# Patient Record
Sex: Female | Born: 1935 | Race: Black or African American | Hispanic: No | Smoking: Never smoker
Health system: Southern US, Community
[De-identification: ages and names within clinical notes are randomized; demographics above are authoritative.]

## PROBLEM LIST (undated history)

## (undated) DIAGNOSIS — I10 Essential (primary) hypertension: Secondary | ICD-10-CM

## (undated) DIAGNOSIS — N189 Chronic kidney disease, unspecified: Secondary | ICD-10-CM

## (undated) DIAGNOSIS — E119 Type 2 diabetes mellitus without complications: Secondary | ICD-10-CM

## (undated) HISTORY — DX: Chronic kidney disease, unspecified: N18.9

## (undated) HISTORY — DX: Essential (primary) hypertension: I10

## (undated) HISTORY — DX: Type 2 diabetes mellitus without complications: E11.9

---

## 2001-07-09 ENCOUNTER — Ambulatory Visit (HOSPITAL_COMMUNITY): Admission: RE | Admit: 2001-07-09 | Discharge: 2001-07-09 | Payer: Self-pay | Admitting: *Deleted

## 2001-07-09 ENCOUNTER — Encounter: Payer: Self-pay | Admitting: *Deleted

## 2001-07-10 ENCOUNTER — Inpatient Hospital Stay (HOSPITAL_COMMUNITY): Admission: AD | Admit: 2001-07-10 | Discharge: 2001-07-12 | Payer: Self-pay | Admitting: Pulmonary Disease

## 2001-08-23 ENCOUNTER — Ambulatory Visit (HOSPITAL_COMMUNITY): Admission: RE | Admit: 2001-08-23 | Discharge: 2001-08-23 | Payer: Self-pay | Admitting: Pulmonary Disease

## 2001-11-11 ENCOUNTER — Encounter: Payer: Self-pay | Admitting: *Deleted

## 2001-11-11 ENCOUNTER — Ambulatory Visit (HOSPITAL_COMMUNITY): Admission: RE | Admit: 2001-11-11 | Discharge: 2001-11-11 | Payer: Self-pay | Admitting: *Deleted

## 2001-12-25 ENCOUNTER — Ambulatory Visit (HOSPITAL_COMMUNITY): Admission: RE | Admit: 2001-12-25 | Discharge: 2001-12-25 | Payer: Self-pay | Admitting: *Deleted

## 2001-12-28 ENCOUNTER — Emergency Department (HOSPITAL_COMMUNITY): Admission: EM | Admit: 2001-12-28 | Discharge: 2001-12-28 | Payer: Self-pay | Admitting: Emergency Medicine

## 2001-12-28 ENCOUNTER — Encounter: Payer: Self-pay | Admitting: Emergency Medicine

## 2002-05-13 ENCOUNTER — Ambulatory Visit (HOSPITAL_COMMUNITY): Admission: RE | Admit: 2002-05-13 | Discharge: 2002-05-13 | Payer: Self-pay | Admitting: *Deleted

## 2003-05-28 ENCOUNTER — Ambulatory Visit (HOSPITAL_COMMUNITY): Admission: RE | Admit: 2003-05-28 | Discharge: 2003-05-28 | Payer: Self-pay | Admitting: *Deleted

## 2003-05-28 ENCOUNTER — Encounter: Payer: Self-pay | Admitting: *Deleted

## 2003-06-25 ENCOUNTER — Ambulatory Visit (HOSPITAL_COMMUNITY): Admission: RE | Admit: 2003-06-25 | Discharge: 2003-06-25 | Payer: Self-pay | Admitting: Vascular Surgery

## 2003-07-02 ENCOUNTER — Ambulatory Visit (HOSPITAL_COMMUNITY): Admission: RE | Admit: 2003-07-02 | Discharge: 2003-07-03 | Payer: Self-pay | Admitting: Otolaryngology

## 2003-08-27 ENCOUNTER — Ambulatory Visit (HOSPITAL_COMMUNITY): Admission: RE | Admit: 2003-08-27 | Discharge: 2003-08-27 | Payer: Self-pay | Admitting: Nephrology

## 2003-12-22 ENCOUNTER — Ambulatory Visit (HOSPITAL_COMMUNITY): Admission: RE | Admit: 2003-12-22 | Discharge: 2003-12-23 | Payer: Self-pay | Admitting: Ophthalmology

## 2004-05-17 ENCOUNTER — Ambulatory Visit (HOSPITAL_COMMUNITY): Admission: RE | Admit: 2004-05-17 | Discharge: 2004-05-17 | Payer: Self-pay | Admitting: Podiatry

## 2005-01-27 ENCOUNTER — Emergency Department (HOSPITAL_COMMUNITY): Admission: EM | Admit: 2005-01-27 | Discharge: 2005-01-27 | Payer: Self-pay | Admitting: Emergency Medicine

## 2007-03-01 ENCOUNTER — Ambulatory Visit (HOSPITAL_COMMUNITY): Admission: RE | Admit: 2007-03-01 | Discharge: 2007-03-01 | Payer: Self-pay | Admitting: Nephrology

## 2008-04-20 ENCOUNTER — Ambulatory Visit (HOSPITAL_COMMUNITY): Admission: RE | Admit: 2008-04-20 | Discharge: 2008-04-20 | Payer: Self-pay | Admitting: Pulmonary Disease

## 2008-07-22 ENCOUNTER — Ambulatory Visit (HOSPITAL_COMMUNITY): Admission: RE | Admit: 2008-07-22 | Discharge: 2008-07-22 | Payer: Self-pay | Admitting: Nephrology

## 2008-08-25 ENCOUNTER — Encounter (INDEPENDENT_AMBULATORY_CARE_PROVIDER_SITE_OTHER): Payer: Self-pay | Admitting: Pulmonary Disease

## 2008-08-25 ENCOUNTER — Ambulatory Visit (HOSPITAL_COMMUNITY): Admission: RE | Admit: 2008-08-25 | Discharge: 2008-08-25 | Payer: Self-pay | Admitting: Pulmonary Disease

## 2008-08-25 ENCOUNTER — Ambulatory Visit: Payer: Self-pay | Admitting: Cardiology

## 2010-09-18 ENCOUNTER — Encounter: Payer: Self-pay | Admitting: Nephrology

## 2010-12-25 ENCOUNTER — Emergency Department (HOSPITAL_COMMUNITY): Payer: No Typology Code available for payment source

## 2010-12-25 ENCOUNTER — Emergency Department (HOSPITAL_COMMUNITY)
Admission: EM | Admit: 2010-12-25 | Discharge: 2010-12-25 | Disposition: A | Discharge status: Home / Self Care | Payer: No Typology Code available for payment source | Attending: Emergency Medicine | Admitting: Emergency Medicine

## 2010-12-25 DIAGNOSIS — Y92410 Unspecified street and highway as the place of occurrence of the external cause: Secondary | ICD-10-CM | POA: Insufficient documentation

## 2010-12-25 DIAGNOSIS — M79609 Pain in unspecified limb: Secondary | ICD-10-CM | POA: Insufficient documentation

## 2010-12-25 DIAGNOSIS — IMO0002 Reserved for concepts with insufficient information to code with codable children: Secondary | ICD-10-CM | POA: Insufficient documentation

## 2010-12-25 DIAGNOSIS — M549 Dorsalgia, unspecified: Secondary | ICD-10-CM | POA: Insufficient documentation

## 2011-01-13 NOTE — Op Note (Signed)
NAMEJHANIYA, BRISKI                          ACCOUNT NO.:  [id number]   MEDICAL RECORD NO.:  [id number]                   PATIENT TYPE:  OIB   LOCATION:  2899                                 FACILITY:  [facility]   PHYSICIAN:  Beulah Gandy. Ashley Royalty, M.D.              DATE OF BIRTH:  1933-12-23   DATE OF PROCEDURE:  12/22/2003  DATE OF DISCHARGE:                                 OPERATIVE REPORT   PREOPERATIVE DIAGNOSIS:  Vitreous hemorrhage in the left eye, proliferative  diabetic retinopathy left eye.   PROCEDURE:  Pars plana vitrectomy with membrane peel left eye.  Retinal  photocoagulation left eye.   SURGEON:  Beulah Gandy. Ashley Royalty, M.D.   ASSISTANT:  Bryan Lemma. Lundquist, P.A.   ANESTHESIA:  General.   DESCRIPTION OF PROCEDURE:  Usual prep and drape.  Peritomies at 10, 2, and 4  o'clock.  4 mm infusion port anchored into place at 4 o'clock.  The lighted  pick and the cutter were placed at 10 and 2 o'clock, respectively.  The pars  plana vitrectomy was begun behind the cataractous lens.  Provisc was placed  on the corneal surface and the 175 Biome was moved into place for viewing.  The blood in the vitreous cavity was carefully removed under low suction and  rapid cutting.  Extensive membranes were seen on the buckle surface and  these were peeled with the lighted pick and removed with the vitreous  cutter.  The vitrectomy was carried down to the macular surface where a  large black scar was present.  Membranes were attached to the edge of the  scar and these were carefully teased away and trimmed and removed.  The  vitrectomy was carried out into the far periphery where additional vitreous  and blood were encountered and removed.  Once all of this was removed, the  endolaser was positioned in the eye.  840 burns were placed around the  retinal periphery with a power of 800 milliwatts, 1000 microns each and 0.1  seconds each.  A washout procedure was performed.  The instruments were then  removed from the eye.  9-0 nylon was used to close the sclerotomy sites.  They were tested with a Weksell and found to be tight.  The conjunctiva was  closed with Wetfield cautery.  Polymixin and gentamicin were irrigated into  tenon's space. Atropine solution was applied.  Decadron 10 mg was injected  into the lower subconjunctival space. The closing tension was 10 with a  Barraquer tonometer.  Complications were none.  Duration one hour.  Polysporin, a patch, and shield were placed.  The patient was awakened and  taken to the recovery room in satisfactory condition.  Beulah Gandy. Ashley Royalty, M.D.    JDM/MEDQ  D:  12/22/2003  T:  12/22/2003  Job:  [id number]

## 2011-01-13 NOTE — Group Therapy Note (Signed)
[facility]  Patient:    [name] Munoz, [name] Munoz Visit Number: [id number] MRN: [id number]          Service Type: MED Location: 2A A202 01 Attending Physician:  Fredirick Maudlin Dictated by:   Kari Baars, M.D. Proc. Date: 07/11/01 Admit Date:  07/10/2001                               Progress Note  PROBLEM LIST: 1. End-stage renal failure. 2. Hypertension.  SUBJECTIVE:  [name] Munoz received dialysis last night and received 1 units of packed red blood cells.  Her hemoglobin is still low; she is going to need more blood.  She has had severe problems with blood pressure.  Otherwise, she has had no new complaints.  She denies any chest pain, fever, chills, etc.  OBJECTIVE:  Her physical exam today shows that her chest is relatively clear. Her heart is regular.  Her abdomen is soft.  Extremities show no edema.  ASSESSMENT: She is still having problems with blood pressure.  PLAN:  Continue with her medications as before, and I am going to add Catapres.  She is going to receive more blood. Dictated by:   Kari Baars, M.D. Attending Physician:  Fredirick Maudlin DD:  07/11/01 TD:  07/11/01 Job: [id number]

## 2011-01-13 NOTE — Group Therapy Note (Signed)
[facility]  Patient:    BRETTE, CAST Visit Number: [id number] MRN: [id number]          Service Type: MED Location: 2A A202 01 Attending Physician:  Fredirick Maudlin Dictated by:   Kari Baars, M.D. Admit Date:  07/10/2001                               Progress Note  PROBLEMS:  Diabetes, severe hypertension, renal failure initiating dialysis.  SUBJECTIVE:  Ms. Levengood says that she is feeling better.  She has had multiple changes made in her antihypertensives and her blood pressure is very well-controlled now, running 130/60.  OBJECTIVE:  Her chest is quite clear.  Her heart is regular.  Her abdomen is soft.  She looks more comfortable.  ASSESSMENT:  She is improving.  PLAN:  Plan is to continue with her treatments and medicines without change. I am hopeful that she will be able to be discharged soon.  I believe she is going to have dialysis again today and will leave decision as to discharge up to Dr. Maudry Diego after the dialysis.  If she cannot go home today, she might be able to go home tomorrow, provided that her blood pressure remains as good as it is.  Her blood sugar has been running around 130 or so.  I do not plan any other new changes in her treatments right now. Dictated by:   Kari Baars, M.D. Attending Physician:  Fredirick Maudlin DD:  07/12/01 TD:  07/12/01 Job: [id number]

## 2011-01-13 NOTE — Consult Note (Signed)
[facility]  Patient:    [name] Munoz, MINI Visit Number: [id number] MRN: [id number]          Service Type: MED Location: 2A A202 01 Attending Physician:  Fredirick Maudlin Dictated by:   Maudry Diego, M.D. Admit Date:  07/10/2001                            Consultation Report  CONTINUATION  PHYSICAL EXAMINATION  GENERAL:  Patient is alert, in no apparent distress.  VITAL SIGNS:  Blood pressure 182/71, heart rate 86, respiratory rate 16.  HEENT:  No conjunctival pallor.  Non-icteric.  Oral mucosa:  Moist and pink.  NECK:  Supple.  No JVD.  CHEST:  Clear to auscultation.  HEART:  Regular rate and rhythm.  No murmur.  ABDOMEN:  Soft, positive bowel sounds.  EXTREMITIES:  No edema.  LABORATORIES:  As stated above, her BUN was 117, creatinine 8.2 with creatinine clearance of 9 cc/minute.  She has a normal potassium of 3.8, albumin 3.3.  ASSESSMENT: 1. Renal insufficiency, chronic.  At this moment patient is reaching end-stage, possibly uremic.  Patient with history of nausea, decreased appetite, feeling sick, and also somewhat anxious with BUN and creatinine which are at very high levels.  She has some edema, but no significant sign of fluid overload. 2. Hypertension.  Patient on Norvasc and labetalol.  Her blood pressure seems to be slightly high, but usually within acceptable range. 3. Diabetes.  Only on Amaryl.  Her blood sugar occasionally becomes low possibly because of worsening renal insufficiency. 4. Anemia.  RECOMMENDATION:  Will initiate hemodialysis.  Once patient feels better will discharge to an outpatient where we will continue with dialysis.  Will do CBC and check her H&H.  If the H&H is low will consider for blood transfusion. Dictated by:   Maudry Diego, M.D. Attending Physician:  Fredirick Maudlin DD:  07/10/01 TD:  07/10/01 Job: [id number]

## 2011-01-13 NOTE — Op Note (Signed)
[place]  Patient:    [name] Munoz, [name] Munoz Visit Number: [id number] MRN: [id number]          Service Type: DSU Location: [facility] 2859 01 Attending Physician:  Melvenia Needles Dictated by:   Denman George, M.D. Proc. Date: 07/09/01 Admit Date:  07/09/2001 Discharge Date: 07/09/2001   CC:         Dr. Yetta Barre, Accident   Operative Report  SURGEON:  Denman George, M.D.  ASSISTANT:  Tollie Pizza. Collins, P.A.-C.  ANESTHESIA:  Local with MAC.  ANESTHESIOLOGIST:  Kaylyn Layer. Michelle Piper, M.D.  PREOPERATIVE DIAGNOSIS:  Chronic renal insufficiency.  POSTOPERATIVE DIAGNOSIS:  Chronic renal insufficiency.  PROCEDURES: 1. Left Cimino fistula. 2. Right internal jugular Diatek dialysis catheter.  CLINICAL NOTE:  A 77 year old female referred for hemodialysis access.  She is diabetic with hypertension and failing renal function.  OPERATIVE PROCEDURE:  The patient is brought to the operating room in stable condition.  Placed in a supine position.  Left arm is prepped and draped in a sterile fashion.  Skin and subcutaneous tissue instilled with 1% Xylocaine.  A longitudinal skin incision made over the left cephalic vein at the wrist.  Dissection carried down through the subcutaneous tissue.  A 3-mm cephalic vein identified, mobilized, and controlled distally with clips.  The vein divided.  The radial artery free and encircled with vessel loops.  The patient administered 2000 units of heparin intravenously.  The radial artery controlled with Seraphim clamps proximally and distally.  A longitudinal arteriotomy made.  The cephalic vein divided and anastomosed end-to-side to the radial artery with running 8-0 Prolene suture. Clamps were then removed.  Excellent flow present through the vein.  Adequate hemostasis obtained.  Sponge and instrument counts correct.  Subcutaneous tissue closed with running 3-0 Vicryl suture.  Skin closed with 4-0  Monocryl.  A large dorsal vein was noted over the hand and a small stab incision made over this which was ligated with 3-0 silk tie.  The incision then closed with interrupted 3-0 Vicryl suture.  The patient then repositioned and the right neck and chest prepped and draped in a sterile fashion.  Skin and subcutaneous tissue at the base of right neck instilled with 1% Xylocaine.  A needle easily introduced into the right internal jugular vein. A 0.035 J wire was passed through the needle into the superior vena cava.  A dilator was passed over the guidewire.  A 16-French dilator and sheath advanced over the guidewire.  The dilator and guidewire removed.  The catheter placed through the sheath and the sheath was removed.  The catheter was positioned at the superior vena cava and right atrial junction.  A tunneling device was then passed subcutaneously to the catheter site and the catheter brought out through the skin in the right supraclavicular fossa.  The hub then assembled onto the catheter and then divided and the hub assembled. The catheter was flushed with heparin and saline solution and capped with heparin.  The patient tolerated the procedure well.  The insertion site was closed with interrupted 3-0 nylon suture.  Sterile dressings applied.  The catheter was affixed to the skin with 2-0 silk suture.  The patient was transferred to the recovery room in stable condition.  Chest x-ray ordered. Dictated by:   Denman George, M.D. Attending Physician:  Melvenia Needles DD:  07/09/01 TD:  07/09/01 Job: [id number]

## 2011-01-13 NOTE — Op Note (Signed)
[place]  Patient:    [name] Munoz, [name] Munoz Visit Number: [id number] MRN: [id number]          Service Type: DSU Location: [facility] 2854 01 Attending Physician:  Melvenia Needles Dictated by:   Denman George, M.D. Proc. Date: 12/25/01 Admit Date:  12/25/2001 Discharge Date: 12/25/2001                             Operative Report  PREOPERATIVE DIAGNOSES: 1. End-stage renal failure. 2. Poorly-functioning left Cimino fistula.  POSTOPERATIVE DIAGNOSES: 1. End-stage renal failure. 2. Poorly-functioning left Cimino fistula.  PROCEDURE:  Revision, left Cimino fistula.  SURGEON:  Denman George, M.D.  ASSISTANT:  Maple Mirza, P.A.  ANESTHESIA:  Local with MAC.  ANESTHESIOLOGIST:  Guadalupe Maple, M.D.  CLINICAL NOTE:  This is a 77 year old female with end-stage renal failure. She currently is on hemodialysis.  She has an Ash catheter in place for dialysis purposes.  She previously underwent placement of a left Cimino fistula.  This, however, has failed to mature adequately.  A fistulogram was performed and reveals a tight stenosis at the anastomosis between the vein and artery and also some large tributaries draining the vein.  She is brought to the operating room at this time for revision of the fistula with hopes this will improve flow through the main cephalic vein and allow dialysis to be performed.  DESCRIPTION OF PROCEDURE:  Patient brought to the operating room in stable condition.  Placed in the supine position.  Left arm prepped and draped in a sterile fashion.  Skin and subcutaneous tissue instilled with 1% Xylocaine with epinephrine. Longitudinal skin incision made through the scar at the left wrist. Dissection carried down through the subcutaneous tissue.  The arteriovenous fistula anastomosis was identified.  The radial artery was mobilized proximally and encircled with a vessel loop.  The vein was also freed.   The vein ligated at the anastomosis with 2-0 silk.  The vein then divided.  The radial artery then controlled proximally and distally with serrefine clamps. A longitudinal arteriotomy made.  The vein beveled and anastomosed end-to-side to the radial artery with running 7-0 Prolene suture.  Clamps were then removed.  The vein was then examined, and two areas of draining tributaries were identified, small incisions made over these, and tributaries of the cephalic vein ligated with 3-0 silk.  Incisions were then closed with 3-0 Vicryl for subcutaneous layer and interrupted 4-0 nylon for skin.  Sterile dressings were applied.  The patient tolerated the procedure well.  Transferred to the recovery room in stable condition. Dictated by:   Denman George, M.D. Attending Physician:  Melvenia Needles DD:  12/25/01 TD:  12/26/01 Job: [id number]

## 2011-01-13 NOTE — Consult Note (Signed)
[facility]  Patient:    [name] Munoz, [name] Munoz Visit Number: [id number] MRN: [id number]          Service Type: MED Location: 2A A202 01 Attending Physician:  Fredirick Maudlin Dictated by:   Maudry Diego, M.D. Proc. Date: 07/10/01 Admit Date:  07/10/2001                            Consultation Report  INCOMPLETE  REASON FOR CONSULT:  Uremia, end-stage.  HISTORY OF PRESENT ILLNESS:  Ms. Messing is a 77 year old African-American female with past medical history of hypertension, diabetes, renal insufficiency thought to be secondary to diabetes who has been followed in my office presently admitted after patient started complaining of weakness and decreased appetite and restlessness.  Ms. Rorabaugh has chronic renal failure reaching end-stage.  Initially patient was advised to have an access but was very hesitant and because of that she delayed having an access.  Simply she started having decreased appetite, nausea and also generally feeling sick, she went for a second opinion which she was told to go ahead with the dialysis. Patient was referred to Dr. Madilyn Fireman where he put a Perma-Cath.  However, patients daughter called back mentioning that the patient seems to be sick, hence she was admitted for initiation of dialysis.  Her BUN and creatinine has increased to 117 and 8.2, respectively.  PAST MEDICAL HISTORY: 1. Diabetes for more than 13 years. 2. History of hypertension for more than 20 years. 3. Diabetic retinopathy status post laser treatment. 4. History of hysterectomy for fibroid. 5. History of chronic renal insufficiency reaching end-stage. 6. History of hypoglycemia. 7. History of congestive heart failure.  ALLERGIES:  PENICILLIN.  CURRENT MEDICATIONS: 1. Norvasc 10 mg p.o. q.d. 2. Levatol 200 mg two tablets in the morning and one tablet in the evening. 3. Amaryl 1 mg p.o. q.d. 4. Lasix 100 mg p.o. q.d.  SOCIAL HISTORY:  No history of smoking or  alcohol.  Patient has very strong supportive family.  She has daughters who have been helping her.  FAMILY HISTORY:  There is history of diabetes but no history of renal insufficiency or coronary artery disease.  However, patient has firsthand experience with dialysis because her husband had renal insufficiency on dialysis before he died.  REVIEW OF SYSTEMS:  No fever, chills or sweating.  Decreased appetite, nausea and generalized weakness.  Feeling of ill health.  No chest pain.  Occasional shortness of breath.  No dizziness, lightheadedness.  No diarrhea, urgency or frequency.  LABORATORY DATA:  Her blood work from July 10, 2001, shows BUN is 110, creatinine 7.3.  She has creatinine clearance done on July 05, 2001, BUN 115, creatinine 8.2 during that time, creatinine clearance was 9 cc per minute.  Her albumin is 3.3, calcium of 7.8, with potassium of 3.8.  At this moment, unfortunately we do not have a CBC.  ASSESSMENT: Dictated by:   Maudry Diego, M.D. Attending Physician:  Fredirick Maudlin DD:  07/10/01 TD:  07/10/01 Job: [id number]

## 2011-01-13 NOTE — H&P (Signed)
[facility]  Patient:    [name] Munoz, ROUNDY Visit Number: [id number] MRN: [id number]          Service Type: MED Location: 2A A202 01 Attending Physician:  Fredirick Maudlin Dictated by:   Kari Baars, M.D. Admit Date:  07/10/2001                           History and Physical  REASON FOR ADMISSION:  This is a 77 year old who has end-stage chronic renal failure.  She had been diagnosed with renal failure some year or so ago.  She has a history of hypertension and she has a history of diabetes, all of which are felt to have contributed to her problems with renal failure.  She had maintained fairly stable renal function but this has begun deteriorating recently and now she has creatinine levels of around 8 with BUNs of greater than 100.  Her hemoglobin now is about 6 and she is brought into the hospital to initiate hemodialysis and for further evaluation and treatment.  PAST MEDICAL HISTORY: 1. End-stage chronic renal failure. 2. Long history of diabetes. 3. Long history of hypertension.  SOCIAL HISTORY:  Negative for significant smoking.  She does not drink any alcohol.  She has had no exposure to toxins as far as is known.  FAMILY HISTORY:  Positive for hypertension and diabetes but no history of end-stage renal disease as far as she knows.  PHYSICAL EXAMINATION:  GENERAL:  Well-developed, well-nourished female who is in no acute distress right now.  VITAL SIGNS:  Blood pressure 130/80.  NECK:  She has a hemodialysis catheter in her neck.  CHEST:  Fairly clear.  HEART:  Regular without gallop.  ABDOMEN:  Soft.  EXTREMITIES:  Showed no edema.  LABORATORY DATA:  Laboratories are as mentioned.  ASSESSMENT: 1. She has what appears to be end-stage chronic renal failure.  PLAN:  For her to be treated with hemodialysis. Dictated by:   Kari Baars, M.D. Attending Physician:  Fredirick Maudlin DD:  07/10/01 TD:  07/10/01 Job:  [id number]

## 2011-01-13 NOTE — Op Note (Signed)
NAMEAAMIRAH, [name] Munoz                          ACCOUNT NO.:  [id number]   MEDICAL RECORD NO.:  [id number]                   PATIENT TYPE:  OIB   LOCATION:  2870                                 FACILITY:  [facility]   PHYSICIAN:  Janetta Hora. Fields, MD               DATE OF BIRTH:  1934/06/10   DATE OF PROCEDURE:  06/25/2003  DATE OF DISCHARGE:  06/25/2003                                 OPERATIVE REPORT   PREOPERATIVE DIAGNOSIS:  End-stage renal disease.   POSTOPERATIVE DIAGNOSIS:  End-stage renal disease.   OPERATION PERFORMED:  1. Right neck ultrasound.  2. Insertion of right internal jugular vein Diatek catheter.  3. Ligation of left arm arteriovenous fistula.  4. Creation of left forearm arteriovenous graft.   SURGEON:  Janetta Hora. Fields, MD   ASSISTANT:  Toribio Harbour, N.P.   ANESTHESIA:  Local with IV sedation.   INDICATIONS FOR PROCEDURE:  The patient is a 77 year old female with a  poorly functioning left arm Cimino fistula.  She requires long term  hemodialysis access.   DESCRIPTION OF PROCEDURE:  After obtaining informed consent, the patient was  taken to the operating room.  The patient was placed in supine position on  the operating table.  Next, a [facility] ultrasound was used to perform an  ultrasound of the right neck.  The right internal jugular vein was found to  be widely patent with normal respiratory variability and compression.  Next  the patient's entire neck and chest were prepped and draped in the usual  sterile fashion.  Local anesthesia was infiltrated over the right internal  jugular vein.  A small gauge finder needle was used to localize the right  internal jugular vein.  A large introducer needle was then used to cannulate  the right internal jugular vein and a 0.035 J-tip guidewire was placed  through the guidewire and into the inferior vena cava using a modified  Seldinger technique.  Next, sequential 12, 14, and 16 French dilators were  placed over the guidewire under fluoroscopic guidance.  The peel-away sheath  was then placed into the internal jugular vein.  A 19 cm Diatek catheter was  brought up in the operative field and placed over the guidewire using a  weave technique through the peel away sheath.  The catheter was placed into  the right atrium under fluoroscopic guidance.  The peel-away sheath was then  removed and the tip was confirmed to be in the right atrium.  The guidewire  was removed as well.  Next, the catheter was tunneled out subcutaneously  over the right clavicular region.  The catheter was sutured into place with  nylon sutures.  The needle insertion site was closed with a Vicryl stitch.  The catheter was noted to flush and draw easily.  The catheter was then  inspected under fluoroscopy and found to have its tip in the right atrium  and no kinks throughout its course.  Dry sterile dressings were applied.  The patient tolerated this portion of the procedure well.  Next, attention  was turned to the left arm.  The entire left upper extremity was prepped and  draped in the usual sterile fashion.  Local anesthesia was infiltrated over  the left antecubital region.  An incision was made just above the  antecubital crease and the dissection was carried down through the  subcutaneous tissues down to the level of the brachial artery.  A large  basilic vein was found in this location. The basilic vein was approximately  4 mm in diameter.  The brachial artery was also 4 mm in diameter.  The  basilic vein was dissected free circumferentially for approximately 3 cm.  Brachial artery was dissected free for a similar amount of distance.  Both  of these vessels were controlled with vessel loops.  Next, local anesthesia  was infiltrated in the midforearm.  A subcutaneous tunnel was then created  up to the level of this local anesthesia infiltration and an incision was  made in this location for assistance in  creating the tunnel.  A 6 mm PTFE  graft was then brought up to the operative field and placed through the  subcutaneous tunnel.  The patient was then given 5000 units of intravenous  heparin.  Next the vessel loops were pulled taut on the vein and a vertical  venotomy was made.  The graft was spatulated and an end of graft to side of  artery anastomosis was created using a running 6-0 Prolene suture.  The  anastomosis was back-bled and fore-bled and then the graft clamped to  prevent bleeding into the graft.  The clamps on the vein were then released.  Next, the graft was spatulated on the arterial end and the vessel loops were  pulled taut.  A vertical arteriotomy was made in the brachial artery and an  end of graft to side of artery anastomosis was created using a running 6-0  Prolene suture.  The usual flushing procedures commenced at nearing the  completion of the anastomosis.  After the anastomosis was completed, the  graft was opened initially followed by release of proximal control and then  release of distal control of the brachial artery.  The graft was noted to  fill immediately and have a palpable thrill. At this point attention was  turned to the lower forearm at the level of the fistula.  The incision from  a previous creation of the Cimino fistula was reopened.  Due to the low flow  in this fistula, it was difficult to find the venous portion of the fistula.  Therefore a small counterincision was made up a few centimeters on the  forearm over the level where the fistula was palpable.  The fistula was  dissected free and a 2-0 silk ligature was placed around it in this  location.  In addition, a segment more proximal on the cephalic vein was  dissected free circumferentially and ligated with two silk ties.  Hemostasis  was obtained in all incisions.  Subcutaneous tissue was then closed with a  running Vicryl stitch at the apex loop graft incision and the antecubital incision.   The skin of all four incisions was then closed with a running 4-0  Vicryl subcuticular stitch.  Benzoin and Steri-Strips were applied to all  incisions.  Dry sterile dressings were then placed on the arm.   The patient tolerated  the procedure well.  There were no complications.  Sponge, needle and instrument counts were correct at the end of the case.  The patient was then transferred to the recovery room in stable condition.                                                Janetta Hora. Fields, MD    CEF/MEDQ  D:  06/25/2003  T:  06/26/2003  Job:  [id number]

## 2011-01-13 NOTE — Discharge Summary (Signed)
[facility]  Patient:    [name] Munoz, [name] Munoz Visit Number: [id number] MRN: [id number]          Service Type: MED Location: 2A A202 01 Attending Physician:  Fredirick Maudlin Dictated by:   Kari Baars, M.D. Admit Date:  07/10/2001 Discharge Date: 07/12/2001                             Discharge Summary  DISCHARGE DIAGNOSES: 1. End-stage chronic renal failure. 2. Severe hypertension. 3. Anemia secondary to #1. 4. Diabetes mellitus.  BRIEF HISTORY:  This is a 77 year old who has had increasing problems with renal failure in the last several months.  She has had chronic renal failure but is has not gotten to the point that she is going to require dialysis which is being arranged.  She had a catheter placed about 2 days ago and is being brought to initiate dialysis.  PAST MEDICAL HISTORY:  Positive for diabetes and hypertension.  PHYSICAL EXAMINATION:  GENERAL:  On admission showed a well-developed well-nourished female who is in no acute distress.  VITAL SIGNS:  Her blood pressure initially was well controlled ______ much higher.   Her lab work showed that her BUN was greater than 100, creatinine around 8.  Hemoglobin was quite low.  CHEST:  Her chest fairly clear.  HEART:  Regular.  ABDOMEN:  Soft.  EXTREMITIES:  Showed no edema.  She had a dialysis catheter placed in the left neck.  ASSESSMENT:  On admission her assessment is that she had end-stage renal failure that was going to require dialysis.  HOSPITAL COURSE:  Dialysis was undertaken and she received dialysis 3 times while hospitalized.  Her blood pressure medications had to be modified but she eventually arteriosclerotic heart disease good control of her blood pressure. Her blood sugar remained under good control initially using sliding-scale insulin.  By the time of discharge she was much improved and ended up with her dialysis arranged as an outpatient and her  antihypertensives are being managed by Dr. Kristian Covey.  These were changed on several occasions during the hospital and she is going to have this as mentioned arranged by Dr. Kristian Covey.  She is going to continue on her medications for her diabetes and she is going to continue all of her other treatments at home. Dictated by:   Kari Baars, M.D. Attending Physician:  Fredirick Maudlin DD:  07/13/01 TD:  07/14/01 Job: [id number]

## 2011-01-13 NOTE — Op Note (Signed)
[name] Munoz, [name] Munoz                          ACCOUNT NO.:  [id number]   MEDICAL RECORD NO.:  [id number]                   PATIENT TYPE:  OIB   LOCATION:  5502                                 FACILITY:  [facility]   PHYSICIAN:  Beulah Gandy. Ashley Royalty, M.D.              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   OPERATION PERFORMED:  Scleral buckle, left eye.  Retinal photocoagulation,  left eye.  Perfluoropropane injection, left eye.   SURGEON:  Beulah Gandy. Ashley Royalty, M.D.   ASSISTANT:  Merian Capron, MA   ANESTHESIA:  General.   DESCRIPTION OF PROCEDURE:  Usual prep and drape.  360 degree limbal  peritomy.  Isolation of four rectus muscles on 2-0 silk.  Localization of  break at 11:30 o'clock.  Scleral dissection from 8 o'clock around to 4  o'clock to admit a #279 intrascleral implant.  Diathermy placed in the bed.  Additional posterior dissection carried out at 11:30 o'clock.  The 279  implant was placed against the globe with a 508G radial segment placed  beneath the break at 11:30 o'clock.  A 240 band was placed around the eye  with a 270 sleeve at 8 o'clock.  Belt loops were placed at 5 and 6 o'clock.  Perforation site chosen at 12 o'clock with a large amount of clear colorless  subretinal fluid coming forth.  Perfluoropropane 50% was injected into the  vitreous cavity to reinflate the globe.  The volume was 1.30mL.  The indirect  ophthalmoscopy showed the retinal to be lying nicely on the scleral buckle.  The indirect ophthalmoscope laser was moved into place, 241 burns were  placed around the retinal break at 11:30 o'clock.  The power was , 1000  microns each and 0.1 seconds each.  The buckle was adjusted and trimmed.  The band  was adjusted and trimmed.  Sutures were knotted and the free ends  removed.  The conjunctiva was reposited with 7-0 chromic suture.  Polymyxin  and gentamicin were irrigated into Tenon's space.  Atropine solution was  applied.  Paracentesis times one.  Obtained closing tension of 10 with  Barraquer tonometer.  Marcaine was injected around the globe for  postoperative pain.  Decadron 10 mg was injected into the lower  subconjunctival space.  Polysporin, a patch and shield were placed.  The  patient was awakened and taken to recovery in satisfactory condition.   COMPLICATIONS:  None.   DURATION:  One and a half hours.  Beulah Gandy. Ashley Royalty, M.D.    JDM/MEDQ  D:  07/02/2003  T:  07/03/2003  Job:  [id number]

## 2011-03-28 ENCOUNTER — Ambulatory Visit (INDEPENDENT_AMBULATORY_CARE_PROVIDER_SITE_OTHER): Payer: Medicare Other | Admitting: Ophthalmology

## 2011-03-28 DIAGNOSIS — H43819 Vitreous degeneration, unspecified eye: Secondary | ICD-10-CM

## 2011-03-28 DIAGNOSIS — E11359 Type 2 diabetes mellitus with proliferative diabetic retinopathy without macular edema: Secondary | ICD-10-CM

## 2011-03-28 DIAGNOSIS — H33009 Unspecified retinal detachment with retinal break, unspecified eye: Secondary | ICD-10-CM

## 2011-05-30 LAB — CBC
HCT: 32.8 — ABNORMAL LOW
MCV: 99.4
Platelets: 183
RBC: 3.3 — ABNORMAL LOW
WBC: 5.6

## 2011-05-30 LAB — BASIC METABOLIC PANEL
CO2: 24
Calcium: 9.6
Chloride: 109
Creatinine, Ser: 8.56 — ABNORMAL HIGH
GFR calc Af Amer: 6 — ABNORMAL LOW
Potassium: 5.1
Sodium: 142

## 2011-05-30 LAB — PROTIME-INR
INR: 1
Prothrombin Time: 13.2

## 2011-06-13 LAB — POTASSIUM: Potassium: 5.2 — ABNORMAL HIGH

## 2011-09-05 ENCOUNTER — Ambulatory Visit (INDEPENDENT_AMBULATORY_CARE_PROVIDER_SITE_OTHER): Payer: Medicare Other | Admitting: Ophthalmology

## 2011-09-05 DIAGNOSIS — E11359 Type 2 diabetes mellitus with proliferative diabetic retinopathy without macular edema: Secondary | ICD-10-CM

## 2011-09-05 DIAGNOSIS — E1165 Type 2 diabetes mellitus with hyperglycemia: Secondary | ICD-10-CM

## 2011-09-05 DIAGNOSIS — H33009 Unspecified retinal detachment with retinal break, unspecified eye: Secondary | ICD-10-CM

## 2011-09-05 DIAGNOSIS — H43819 Vitreous degeneration, unspecified eye: Secondary | ICD-10-CM

## 2012-03-05 ENCOUNTER — Ambulatory Visit (INDEPENDENT_AMBULATORY_CARE_PROVIDER_SITE_OTHER): Payer: Medicare Other | Admitting: Ophthalmology

## 2012-03-14 ENCOUNTER — Ambulatory Visit (INDEPENDENT_AMBULATORY_CARE_PROVIDER_SITE_OTHER): Payer: Medicare Other | Admitting: Ophthalmology

## 2012-03-14 DIAGNOSIS — E1139 Type 2 diabetes mellitus with other diabetic ophthalmic complication: Secondary | ICD-10-CM

## 2012-03-14 DIAGNOSIS — E11359 Type 2 diabetes mellitus with proliferative diabetic retinopathy without macular edema: Secondary | ICD-10-CM

## 2012-03-14 DIAGNOSIS — H33009 Unspecified retinal detachment with retinal break, unspecified eye: Secondary | ICD-10-CM

## 2012-03-14 DIAGNOSIS — H43819 Vitreous degeneration, unspecified eye: Secondary | ICD-10-CM

## 2012-03-14 DIAGNOSIS — I10 Essential (primary) hypertension: Secondary | ICD-10-CM

## 2012-09-17 ENCOUNTER — Ambulatory Visit (INDEPENDENT_AMBULATORY_CARE_PROVIDER_SITE_OTHER): Payer: Medicare Other | Admitting: Ophthalmology

## 2012-09-17 DIAGNOSIS — E1165 Type 2 diabetes mellitus with hyperglycemia: Secondary | ICD-10-CM

## 2012-09-17 DIAGNOSIS — H43819 Vitreous degeneration, unspecified eye: Secondary | ICD-10-CM

## 2012-09-17 DIAGNOSIS — Q143 Congenital malformation of choroid: Secondary | ICD-10-CM

## 2012-09-17 DIAGNOSIS — I10 Essential (primary) hypertension: Secondary | ICD-10-CM

## 2012-09-17 DIAGNOSIS — H35039 Hypertensive retinopathy, unspecified eye: Secondary | ICD-10-CM

## 2012-09-17 DIAGNOSIS — E11359 Type 2 diabetes mellitus with proliferative diabetic retinopathy without macular edema: Secondary | ICD-10-CM

## 2012-09-17 DIAGNOSIS — E1139 Type 2 diabetes mellitus with other diabetic ophthalmic complication: Secondary | ICD-10-CM

## 2012-12-12 ENCOUNTER — Ambulatory Visit: Payer: Self-pay | Admitting: Vascular Surgery

## 2013-03-18 ENCOUNTER — Ambulatory Visit (INDEPENDENT_AMBULATORY_CARE_PROVIDER_SITE_OTHER): Payer: Medicare Other | Admitting: Ophthalmology

## 2013-04-15 ENCOUNTER — Ambulatory Visit (INDEPENDENT_AMBULATORY_CARE_PROVIDER_SITE_OTHER): Payer: Medicare Other | Admitting: Ophthalmology

## 2013-04-15 DIAGNOSIS — H43819 Vitreous degeneration, unspecified eye: Secondary | ICD-10-CM

## 2013-04-15 DIAGNOSIS — H35039 Hypertensive retinopathy, unspecified eye: Secondary | ICD-10-CM

## 2013-04-15 DIAGNOSIS — I10 Essential (primary) hypertension: Secondary | ICD-10-CM

## 2013-04-15 DIAGNOSIS — E1139 Type 2 diabetes mellitus with other diabetic ophthalmic complication: Secondary | ICD-10-CM

## 2013-04-15 DIAGNOSIS — E11359 Type 2 diabetes mellitus with proliferative diabetic retinopathy without macular edema: Secondary | ICD-10-CM

## 2013-10-16 ENCOUNTER — Ambulatory Visit (INDEPENDENT_AMBULATORY_CARE_PROVIDER_SITE_OTHER): Payer: Medicare Other | Admitting: Ophthalmology

## 2013-10-23 ENCOUNTER — Ambulatory Visit (INDEPENDENT_AMBULATORY_CARE_PROVIDER_SITE_OTHER): Payer: Medicare Other | Admitting: Ophthalmology

## 2013-11-18 ENCOUNTER — Ambulatory Visit (INDEPENDENT_AMBULATORY_CARE_PROVIDER_SITE_OTHER): Payer: Medicare Other

## 2013-11-18 VITALS — BP 139/42 | HR 75 | Resp 16 | Ht 62.0 in | Wt 160.0 lb

## 2013-11-18 DIAGNOSIS — S90129A Contusion of unspecified lesser toe(s) without damage to nail, initial encounter: Secondary | ICD-10-CM

## 2013-11-18 DIAGNOSIS — E1140 Type 2 diabetes mellitus with diabetic neuropathy, unspecified: Secondary | ICD-10-CM

## 2013-11-18 DIAGNOSIS — E1149 Type 2 diabetes mellitus with other diabetic neurological complication: Secondary | ICD-10-CM

## 2013-11-18 DIAGNOSIS — E1142 Type 2 diabetes mellitus with diabetic polyneuropathy: Secondary | ICD-10-CM

## 2013-11-18 DIAGNOSIS — L03039 Cellulitis of unspecified toe: Secondary | ICD-10-CM

## 2013-11-18 NOTE — Patient Instructions (Signed)
ANTIBACTERIAL SOAP INSTRUCTIONS  THE DAY AFTER PROCEDURE  Please follow the instructions your doctor has marked.   Shower as usual. Before getting out, place a drop of antibacterial liquid soap (Dial) on a wet, clean washcloth.  Gently wipe washcloth over affected area.  Afterward, rinse the area with warm water.  Blot the area dry with a soft cloth and cover with antibiotic ointment (neosporin, polysporin, bacitracin) and band aid or gauze and tape  Place 3-4 drops of antibacterial liquid soap in a quart of warm tap water.  Submerge foot into water for 20 minutes.  If bandage was applied after your procedure, leave on to allow for easy lift off, then remove and continue with soak for the remaining time.  Next, blot area dry with a soft cloth and cover with a bandage.  After cleansing with either soap and water or Epsom salts in warm water dry the toe thoroughly and apply the meuprosin Bactrim ointment and Band-Aid as instructed

## 2013-11-18 NOTE — Progress Notes (Signed)
   Subjective:    Patient ID: [name] Munoz, female    DOB: 08-31-1933, 80 y.o.   MRN: [id number]  HPI Comments: "I bumped this toe"  Patient states that she bumped the 1st toe right about 2.5 weeks ago. The nail came loose. It did not bleed. She trimmed the nail back and saw PCP. He Rx'd Avelox 400 mg QD and Mupirocin ointment BID. She soaked initially. Nail still loose and wanted checked.     Review of Systems  HENT: Negative.   Respiratory: Negative.   Cardiovascular: Positive for leg swelling.  Gastrointestinal: Negative.   Genitourinary: Negative.   Musculoskeletal: Positive for gait problem.  Skin: Negative.   Allergic/Immunologic: Negative.   Neurological: Positive for weakness and numbness.  Hematological: Negative.   Psychiatric/Behavioral: Negative.   All other systems reviewed and are negative.       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 Patient is a wheelchair nonambulatory. Epicritic and proprioceptive sensations grossly diminished on [facility] there is normal plantar response DTRs not listed dermatologically skin color pigment normal hair growth absent nails somewhat criptotic incurvated darkened patient does have contusion to the right hallux nail plate which show some partial separation distally there is mild serous drainage and some maceration patient is been applying the Bactroban ointment and soaking Epson salts has been taking her Avelox antibiotic as instructed. There are no other open wounds no secondary infections no ascending psoas lymphangitis noted at this time. No malodor noted at this time. 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 Patient is a contusion to right hallux nail plate nail bed with separation of the loose portion nails debrided away at this time the nailbed appears to be clean although there was a nailbed laceration advised to continue with daily cleansing with Epson salts also maintain antibiotic ointment Bactroban ointment daily application Silvadene gauze dressing applied at today's visit. Reappointed one month for long-term followup on a do normal bathing and hygiene otherwise maintain the of the ointment and a Band-Aid to keep the toe nailbed protected. If there's any exacerbations increased redness swelling discharge drainage or odor she is to contact us daily to the emergency room as instructed. Followup in one month for reevaluation and nail check  Alvan Dameichard Seiya Silsby DPM

## 2013-11-20 ENCOUNTER — Ambulatory Visit (INDEPENDENT_AMBULATORY_CARE_PROVIDER_SITE_OTHER): Payer: Medicare Other | Admitting: Ophthalmology

## 2013-11-20 DIAGNOSIS — I10 Essential (primary) hypertension: Secondary | ICD-10-CM

## 2013-11-20 DIAGNOSIS — E11359 Type 2 diabetes mellitus with proliferative diabetic retinopathy without macular edema: Secondary | ICD-10-CM

## 2013-11-20 DIAGNOSIS — E1139 Type 2 diabetes mellitus with other diabetic ophthalmic complication: Secondary | ICD-10-CM

## 2013-11-20 DIAGNOSIS — H35039 Hypertensive retinopathy, unspecified eye: Secondary | ICD-10-CM

## 2013-11-20 DIAGNOSIS — H35379 Puckering of macula, unspecified eye: Secondary | ICD-10-CM

## 2013-11-20 DIAGNOSIS — H43819 Vitreous degeneration, unspecified eye: Secondary | ICD-10-CM

## 2013-11-20 DIAGNOSIS — E1165 Type 2 diabetes mellitus with hyperglycemia: Secondary | ICD-10-CM

## 2013-11-20 DIAGNOSIS — H31009 Unspecified chorioretinal scars, unspecified eye: Secondary | ICD-10-CM

## 2013-12-16 ENCOUNTER — Ambulatory Visit (INDEPENDENT_AMBULATORY_CARE_PROVIDER_SITE_OTHER): Payer: Medicare Other

## 2013-12-16 VITALS — BP 114/52 | HR 76 | Resp 12

## 2013-12-16 DIAGNOSIS — E1142 Type 2 diabetes mellitus with diabetic polyneuropathy: Secondary | ICD-10-CM

## 2013-12-16 DIAGNOSIS — S90129A Contusion of unspecified lesser toe(s) without damage to nail, initial encounter: Secondary | ICD-10-CM

## 2013-12-16 DIAGNOSIS — E1140 Type 2 diabetes mellitus with diabetic neuropathy, unspecified: Secondary | ICD-10-CM

## 2013-12-16 DIAGNOSIS — E1149 Type 2 diabetes mellitus with other diabetic neurological complication: Secondary | ICD-10-CM

## 2013-12-16 DIAGNOSIS — L03039 Cellulitis of unspecified toe: Secondary | ICD-10-CM

## 2013-12-16 NOTE — Progress Notes (Signed)
   Subjective:    Patient ID: [name] Munoz, female    DOB: [birth year], 80 y.o.   MRN: [id number]  HPI '' RT FOOT GREAT TOE IS DOING AND [facility].    Review of Systems no new systemic changes or findings are noted     Objective:   Physical Exam Neurovascular status is diminished patient is thready DP and PT pulses plus one over 4 both on the right leg patient is an amputation on her left side patient had a contusion to the left hallux or crutch right hallux nail bed there is a slight eschar tissue present mild dried blood no active bleeding no discharge or drainage no ascending cellulitis lymphangitis but looks good clean and dry continue with them the pros and antibiotic ointment and Band-Aid dressing daily soap and dried hemorrhage a keratotic skin around the edge of the nail and some of the residual nail spicule are debrided at this time. Patient does have hemorrhage a keratosis around the periphery of the nail over the ulcerated nailbed which has a black mild eschar proximal the half centimeter in diameter again no discharge drainage no malodor noted      Assessment & Plan:  Assessment resolving contusion and paronychia of right great toe. No active infection is visualized in a bike women and gauze dressing applied maintain dressing and ointment for leaks 710 more days then discontinue followup with a month unless it fails to heal resolve completely  Alvan Dameichard Milania Haubner DPM

## 2013-12-16 NOTE — Patient Instructions (Signed)
ANTIBACTERIAL SOAP INSTRUCTIONS  THE DAY AFTER PROCEDURE  Please follow the instructions your doctor has marked.   Shower as usual. Before getting out, place a drop of antibacterial liquid soap (Dial) on a wet, clean washcloth.  Gently wipe washcloth over affected area.  Afterward, rinse the area with warm water.  Blot the area dry with a soft cloth and cover with antibiotic ointment (neosporin, polysporin, bacitracin) and band aid or gauze and tape  Place 3-4 drops of antibacterial liquid soap in a quart of warm tap water.  Submerge foot into water for 20 minutes.  If bandage was applied after your procedure, leave on to allow for easy lift off, then remove and continue with soak for the remaining time.  Next, blot area dry with a soft cloth and cover with a bandage.  Apply other medications as directed by your doctor, such as cortisporin otic solution (eardrops) or neosporin antibiotic ointment  After soaking and drying thoroughly apply the antibiotic ointment and a Band-Aid dressing for at least 7-10 more days to discontinue if there is no drainage or discharge or any residual sign of infection to

## 2014-01-26 DEATH — deceased

## 2014-02-12 IMAGING — XA IR VASCULAR PROCEDURE
10 series · 14 of 14 positions shown · non-contrast
Comparison: none

[Series 1: care upper arm · 2 of 2 slices shown (1 of 8)]
[im 1/2]
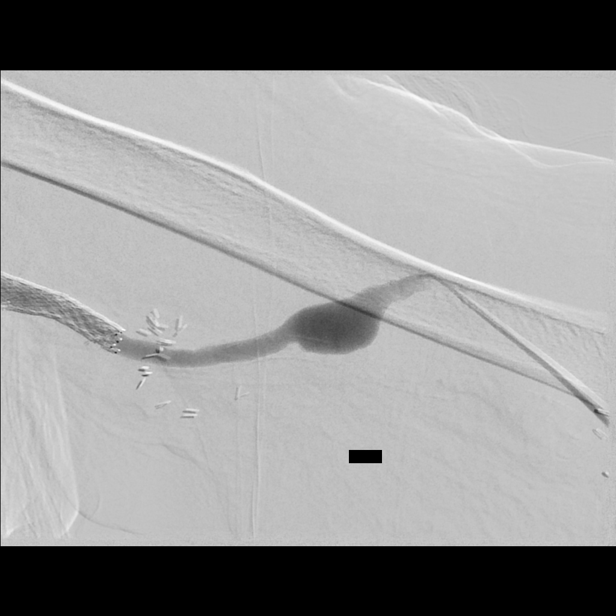
[im 2/2]
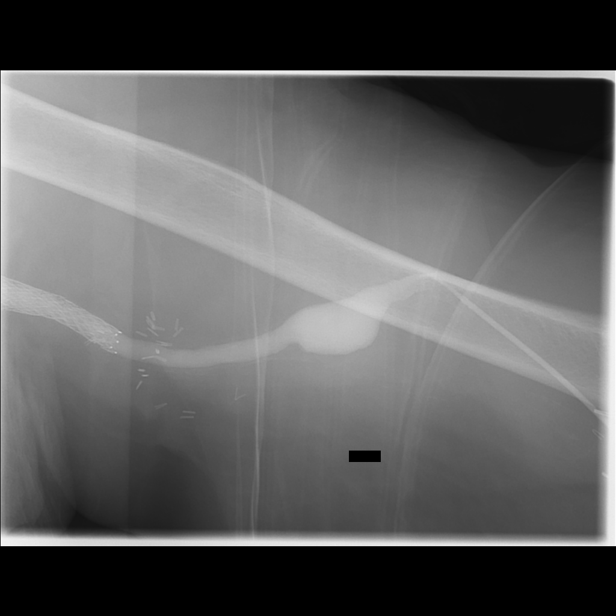

[Series 2: care upper arm · 2 of 2 slices shown (2 of 8)]
[im 1/2]
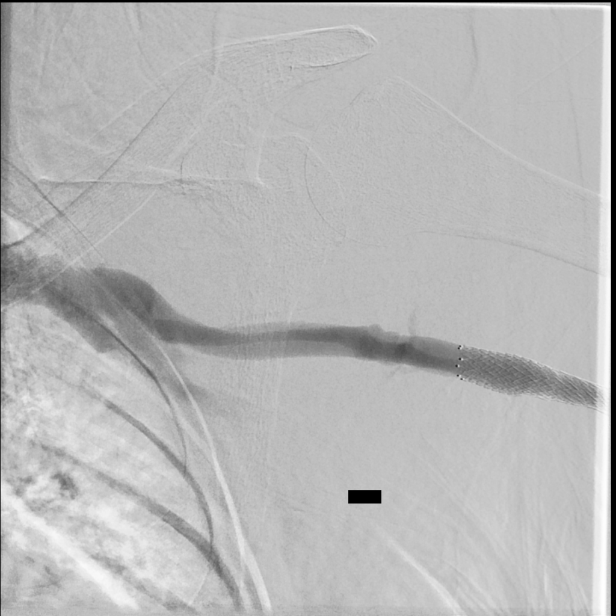
[im 2/2]
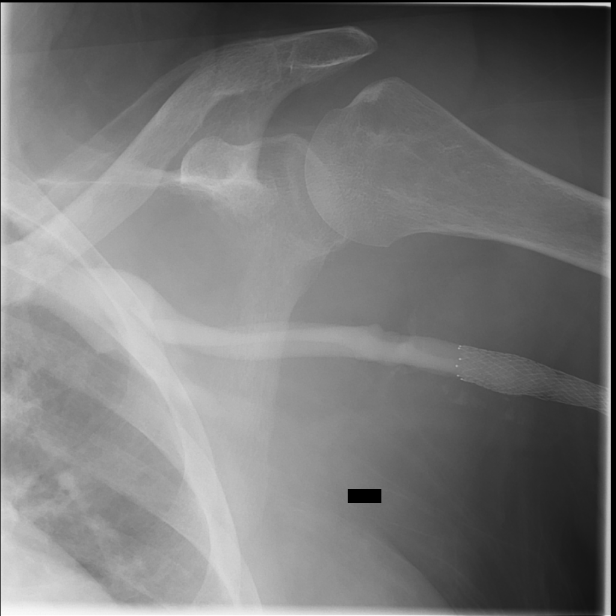

[Series 3: care upper arm · 2 of 2 slices shown (3 of 8)]
[im 1/2]
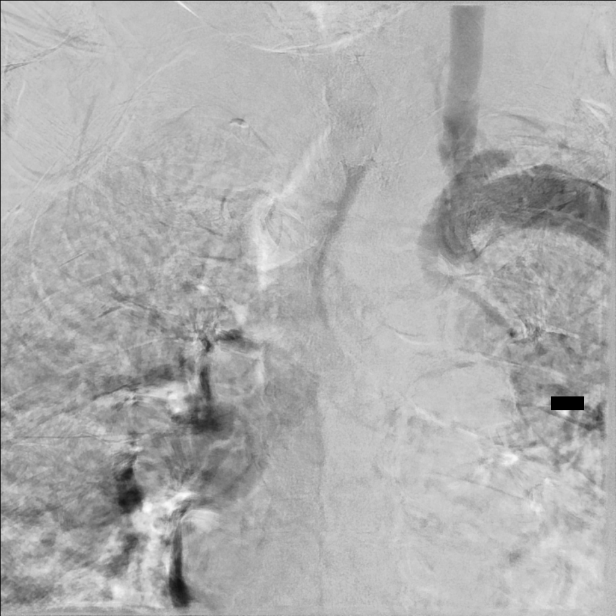
[im 2/2]
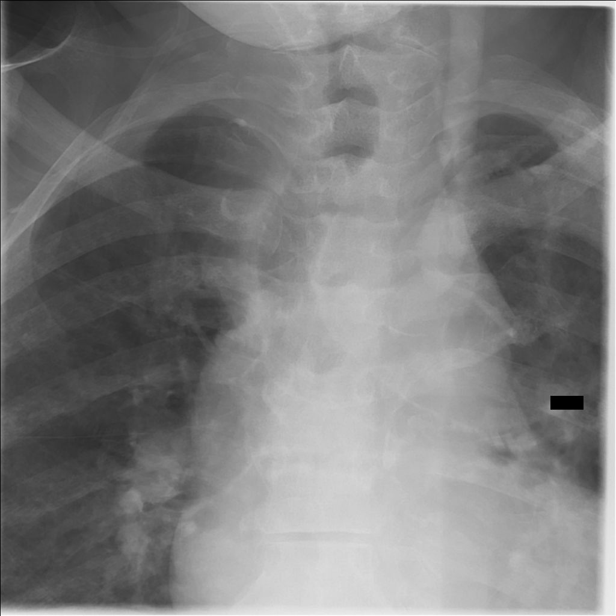

[Series 4: fl - angio · 1 of 1 slices shown (1 of 2)]
[im 1/1]
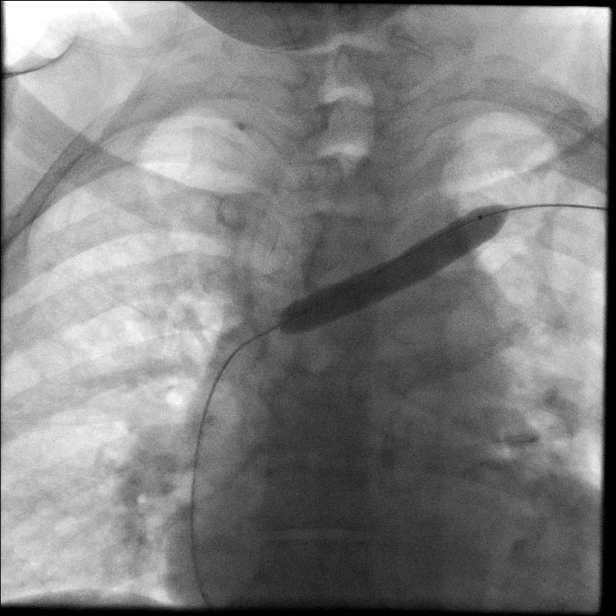

[Series 5: care upper arm · 1 of 1 slices shown (4 of 8)]
[im 1/1]
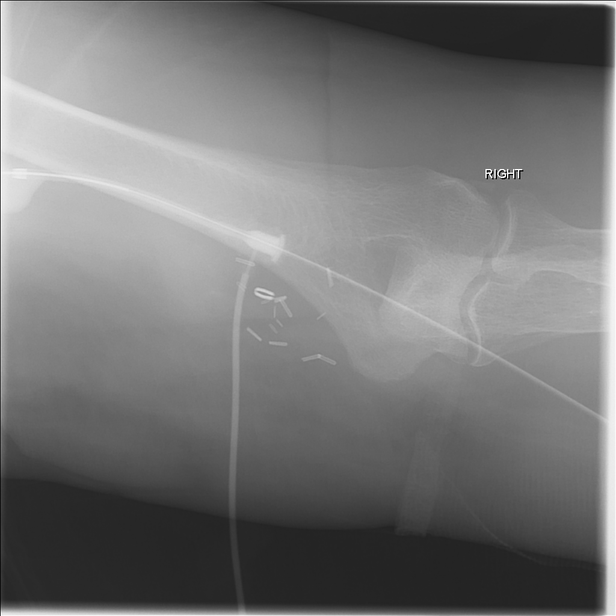

[Series 6: care upper arm · 1 of 1 slices shown (5 of 8)]
[im 1/1]
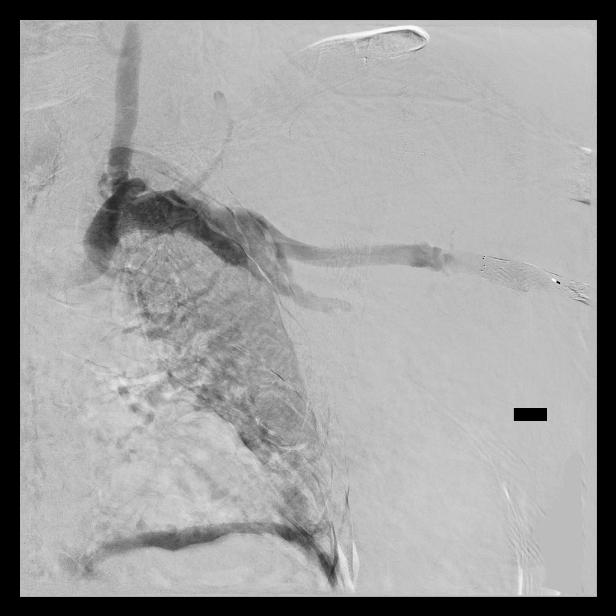

[Series 7: care upper arm · 2 of 2 slices shown (6 of 8)]
[im 1/2]
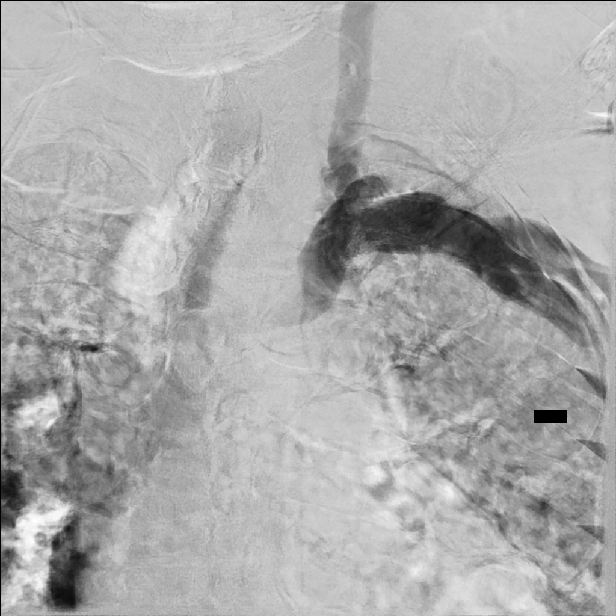
[im 2/2]
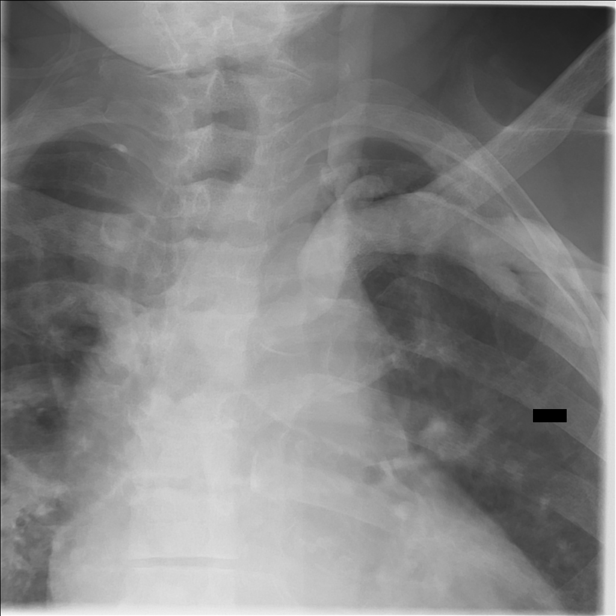

[Series 8: fl - angio · 1 of 1 slices shown (2 of 2)]
[im 1/1]
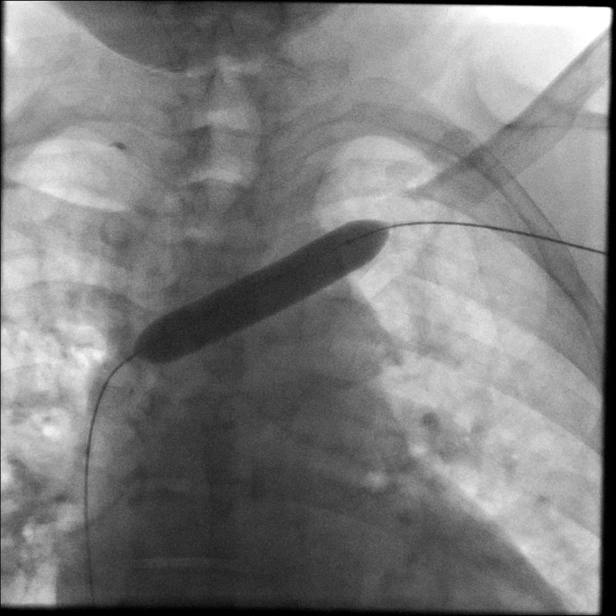

[Series 10: care upper arm · 1 of 1 slices shown (7 of 8)]
[im 1/1]
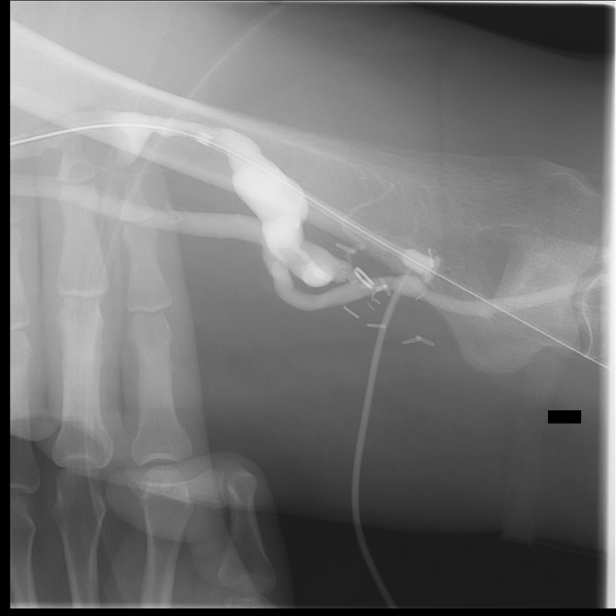

[Series 11: care upper arm · 1 of 1 slices shown (8 of 8)]
[im 1/1]
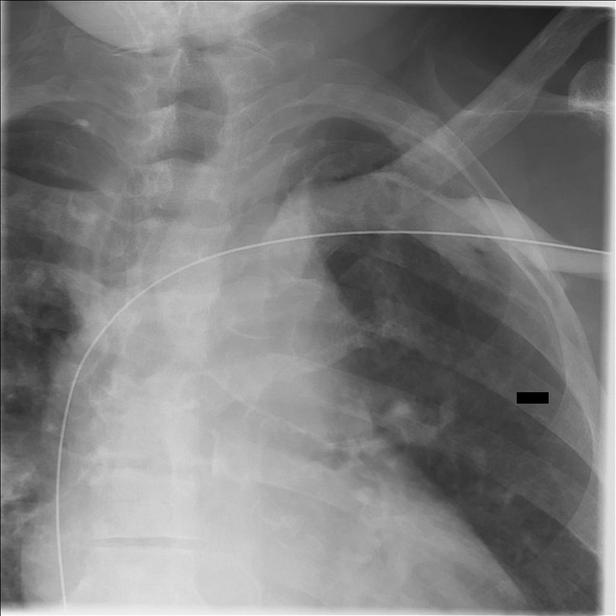

[14 of 14 positions shown; findings below may reference images not displayed]

IMAGES IMPORTED FROM THE SYNGO WORKFLOW SYSTEM
NO DICTATION FOR STUDY

## 2014-05-28 ENCOUNTER — Ambulatory Visit (INDEPENDENT_AMBULATORY_CARE_PROVIDER_SITE_OTHER): Payer: Medicare Other | Admitting: Ophthalmology

## 2014-12-18 NOTE — Op Note (Signed)
PATIENT NAMTressia Munoz:  Munoz Munoz MR#:  Munoz DATE OF BIRTH:  May 14, 1934  DATE OF PROCEDURE:  12/12/2012  PREOPERATIVE DIAGNOSES: 1.  Endstage renal disease.  2.  Poorly functioning left arm arteriovenous shunt.  3.  Hypertension.  4.  Status post left leg amputation.  5.  Diabetes.   POSTOPERATIVE DIAGNOSES: 1.  Endstage renal disease.  2.  Poorly functioning left arm arteriovenous shunt.  3.  Hypertension.  4.  Status post left leg amputation.  5.  Diabetes.   PROCEDURES PERFORMED: 1.  Ultrasound guidance for vascular access, left arm arteriovenous graft.  2.  Left upper extremity shuntogram and central venogram.  3.  Percutaneous transluminal angioplasty of left innominate vein with 10- and 12-mm diameter angioplasty balloon.   SURGEON: Munoz Munoz, M.D.   ANESTHESIA: Local with moderate conscious sedation.   ESTIMATED BLOOD LOSS: Minimal.   INDICATION FOR PROCEDURE: A 81 year old [place] with endstage renal disease. We were asked to evaluate her dialysis access for poor function. She does have two aneurysmal areas that are being used as the arterial and venous access sites, and the graft is patent.   DESCRIPTION OF PROCEDURE: The patient is brought to the vascular and interventional radiology suite. Left upper extremity was sterilely prepped and draped, and a sterile surgical field was created. The graft was accessed near the normal arterial access site with a micropuncture needle, and a micropuncture wire and sheath were placed, and imaging was performed through this. 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 The superior vena cava was then patent. I then upsized to a 6-French sheath. That crossed the lesion without difficulty with a  Magic torque wire and initially took a 10-mm diameter angioplasty balloon, and this had a surprisingly tight waist at the left innominate vein, worse than the stenosis appeared on the initial imaging, This went to profile, but there was still significant residual stenosis and more blood traversing up the jugular vein than through the innominate vein. I upsized to a 12-mm diameter angioplasty balloon and took this to burst pressure. At this, the waist that was taken resolved, and it fit the profile of the vein. The balloon was deflated. While the balloon was inflated, I held pressure on the graft to evaluate the arterial anastomosis, which appeared patent, and there was again a moderate-sized aneurysmal degeneration at the arterial access site. She had a patent DRIL bypass that appeared to her left upper extremity. At this point, the balloon was deflated and removed. Completion venogram showed significant improvement in the central venous circulation with more blood traversing through the innominate vein than back up the jugular vein and did not appear to have significant residual stenosis. At this point, I elected to terminate the procedure. The sheath was removed around a 4-0 Monocryl purse suture. Pressure was held. Sterile dressing was placed. The patient tolerated the procedure well and was taken to the recovery room in stable condition.   ____________________________ Munoz NeedyJason S. Zoraya Fiorenza, MD jsd:lg D: 12/12/2012 15:13:09 ET T: 12/12/2012 16:07:57 ET JOB#: [id number]  cc: Munoz NeedyJason S. Mathew Storck, MD, <Dictator> Munoz NeedyJASON S Eastyn Dattilo MD ELECTRONICALLY SIGNED 12/16/2012 13:59
